# Patient Record
Sex: Male | Born: 1945 | Race: White | Hispanic: No | Marital: Married | Smoking: Current every day smoker
Health system: Southern US, Community
[De-identification: ages and names within clinical notes are randomized; demographics above are authoritative.]

## PROBLEM LIST (undated history)

## (undated) DIAGNOSIS — Z86010 Personal history of colonic polyps: Secondary | ICD-10-CM

## (undated) DIAGNOSIS — Z860101 Personal history of adenomatous and serrated colon polyps: Secondary | ICD-10-CM

## (undated) DIAGNOSIS — E785 Hyperlipidemia, unspecified: Secondary | ICD-10-CM

## (undated) DIAGNOSIS — J449 Chronic obstructive pulmonary disease, unspecified: Secondary | ICD-10-CM

## (undated) HISTORY — DX: Chronic obstructive pulmonary disease, unspecified: J44.9

## (undated) HISTORY — DX: Personal history of colonic polyps: Z86.010

## (undated) HISTORY — DX: Personal history of adenomatous and serrated colon polyps: Z86.0101

## (undated) HISTORY — DX: Hyperlipidemia, unspecified: E78.5

---

## 2001-11-06 ENCOUNTER — Ambulatory Visit (HOSPITAL_COMMUNITY): Admission: RE | Admit: 2001-11-06 | Discharge: 2001-11-06 | Payer: Self-pay | Admitting: General Surgery

## 2003-02-25 ENCOUNTER — Ambulatory Visit (HOSPITAL_COMMUNITY): Admission: RE | Admit: 2003-02-25 | Discharge: 2003-02-25 | Payer: Self-pay | Admitting: General Surgery

## 2004-11-18 ENCOUNTER — Ambulatory Visit (HOSPITAL_COMMUNITY): Admission: RE | Admit: 2004-11-18 | Discharge: 2004-11-18 | Payer: Self-pay | Admitting: Family Medicine

## 2004-12-27 ENCOUNTER — Ambulatory Visit (HOSPITAL_COMMUNITY): Admission: RE | Admit: 2004-12-27 | Discharge: 2004-12-27 | Payer: Self-pay | Admitting: Ophthalmology

## 2005-11-14 ENCOUNTER — Ambulatory Visit (HOSPITAL_COMMUNITY): Admission: RE | Admit: 2005-11-14 | Discharge: 2005-11-14 | Payer: Self-pay | Admitting: Ophthalmology

## 2007-09-04 ENCOUNTER — Ambulatory Visit (HOSPITAL_COMMUNITY): Admission: RE | Admit: 2007-09-04 | Discharge: 2007-09-04 | Payer: Self-pay | Admitting: Family Medicine

## 2007-09-10 ENCOUNTER — Ambulatory Visit (HOSPITAL_COMMUNITY): Admission: RE | Admit: 2007-09-10 | Discharge: 2007-09-10 | Payer: Self-pay | Admitting: Family Medicine

## 2008-08-30 IMAGING — CR DG CHEST SPECIAL VIEW
1 series · 1 of 1 positions shown · non-contrast
Comparison: none

HISTORY: Abnormal chest x-ray, question lung nodule versus nipple shadow

[view not recorded]
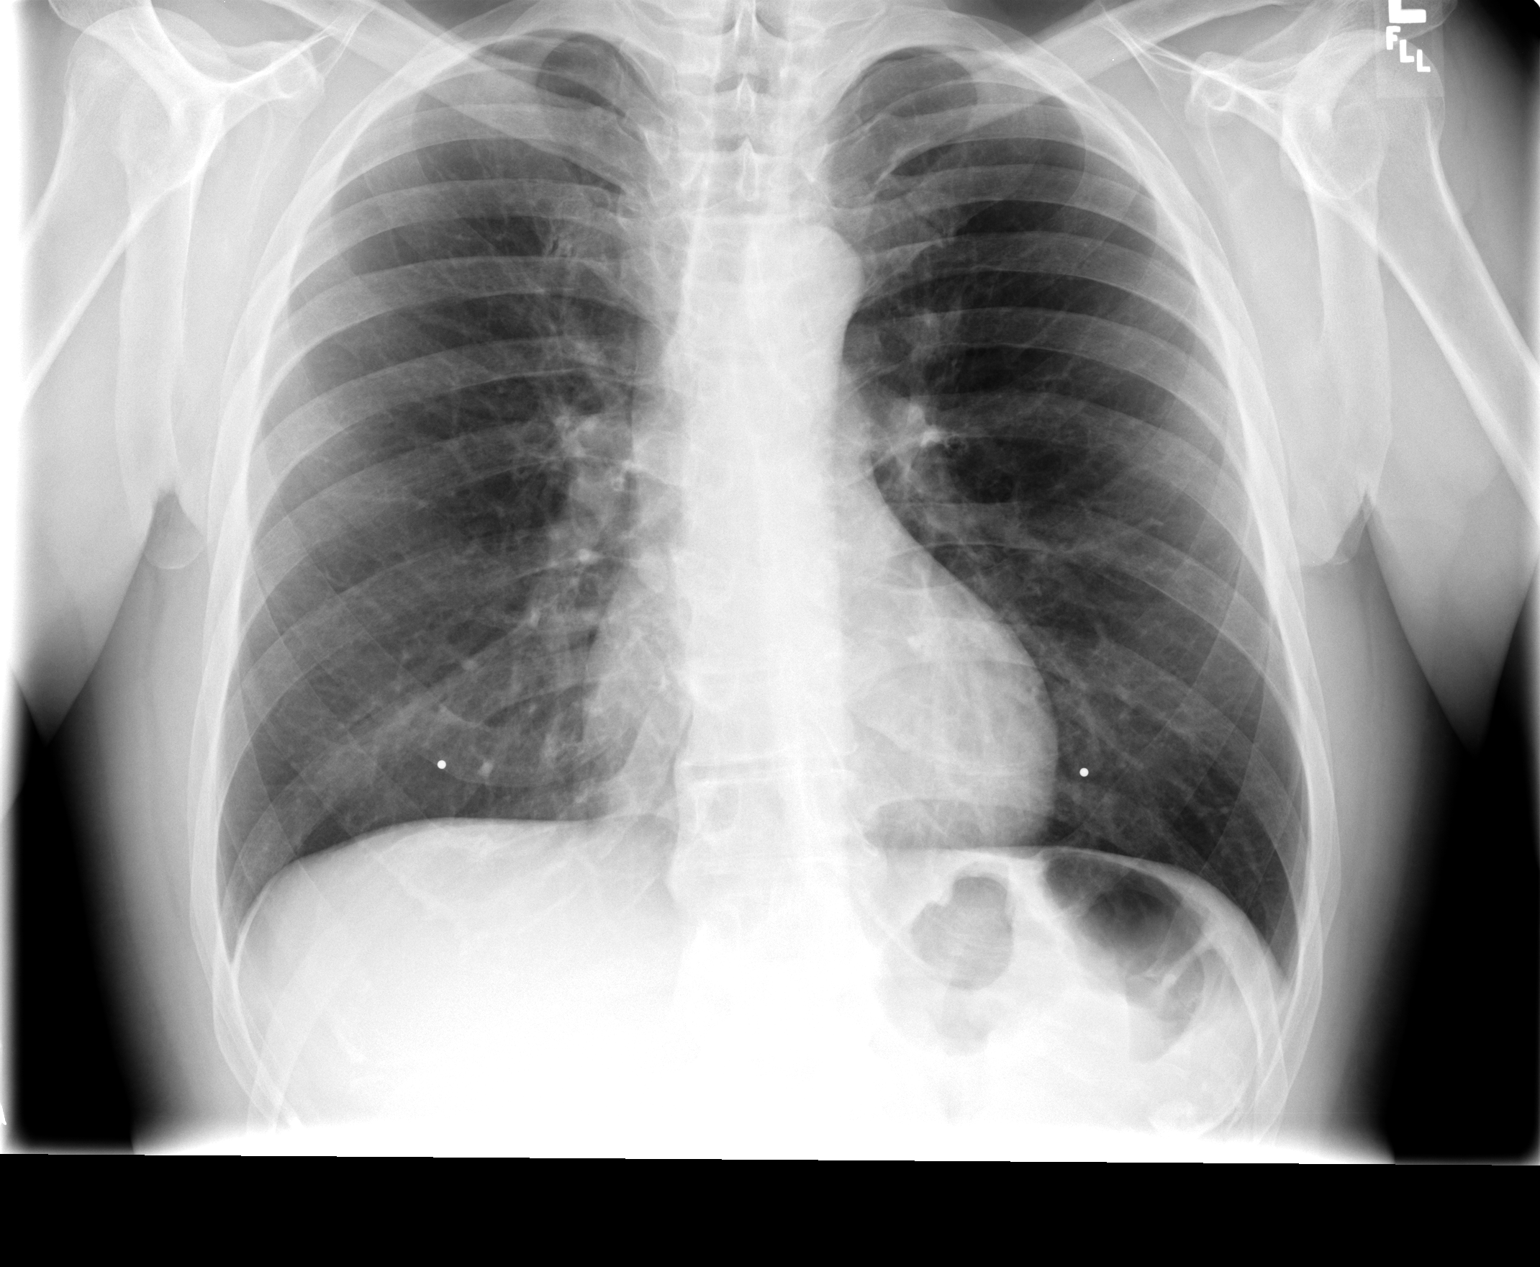

[1 of 1 positions shown; findings below may reference images not displayed]

CHEST ONE VIEW PA WITH NIPPLE MARKERS:

Comparison 09/04/2007

The right nipple marker corresponds in position with the nodular density seen on
previous exam.
Finding is compatible with nipple shadow.
No evidence of pulmonary nodule.
Heart size, mediastinal contours, pulmonary vascularity remain normal.
Persistent peribronchial thickening without infiltrate or effusion.
IMPRESSION: No evidence of pulmonary nodule.
Mild persistent bronchitic changes.

## 2008-09-03 ENCOUNTER — Ambulatory Visit (HOSPITAL_COMMUNITY): Admission: RE | Admit: 2008-09-03 | Discharge: 2008-09-03 | Payer: Self-pay | Admitting: Family Medicine

## 2008-09-22 ENCOUNTER — Ambulatory Visit: Payer: Self-pay | Admitting: Internal Medicine

## 2008-10-15 ENCOUNTER — Ambulatory Visit: Payer: Self-pay | Admitting: Internal Medicine

## 2008-10-15 ENCOUNTER — Encounter: Payer: Self-pay | Admitting: Internal Medicine

## 2008-10-15 ENCOUNTER — Ambulatory Visit (HOSPITAL_COMMUNITY): Admission: RE | Admit: 2008-10-15 | Discharge: 2008-10-15 | Payer: Self-pay | Admitting: Internal Medicine

## 2008-10-15 HISTORY — PX: COLONOSCOPY: SHX174

## 2009-09-03 ENCOUNTER — Ambulatory Visit (HOSPITAL_COMMUNITY): Admission: RE | Admit: 2009-09-03 | Discharge: 2009-09-03 | Payer: Self-pay | Admitting: Family Medicine

## 2010-08-30 ENCOUNTER — Ambulatory Visit (HOSPITAL_COMMUNITY): Admission: RE | Admit: 2010-08-30 | Discharge: 2010-08-30 | Payer: Self-pay | Admitting: Family Medicine

## 2011-04-18 NOTE — Op Note (Signed)
NAME:  Wesley Hodges, Wesley Hodges NO.:  [id number]   MEDICAL RECORD NO.:  [id number]          PATIENT TYPE:  AMB   LOCATION:  DAY                           FACILITY:  [facility]   PHYSICIAN:  R. Roetta Sessions, M.D. DATE OF BIRTH:  23-Apr-1946   DATE OF PROCEDURE:  10/15/2008  DATE OF DISCHARGE:                               OPERATIVE REPORT   PROCEDURE:  Colonoscopy with snare polypectomy and polyp ablation.   INDICATIONS FOR PROCEDURE:  A 65 year old gentleman with history of  colonic adenomas on multiple colonoscopies with Dr. Katrinka Blazing previously.  It has been a good 5 years since he last his colon imaged.  He has no  lower GI tract symptoms.  Colonoscopy is now being done.  Risks,  benefits, alternatives, and limitations have been reviewed.  Questions  answered.  Please see the documentation in the medical record.   PROCEDURE NOTE:  O2 saturation, blood pressure, pulse, and respirations  were monitored throughout the entire procedure.   CONSCIOUS SEDATION:  IV Versed and Demerol in incremental doses.   INSTRUMENTATION:  Pentax video chip system.   FINDINGS:  Digital rectal exam revealed no abnormalities.  Endoscopic  findings:  Prep was adequate.  Colon:  Colonic mucosa was surveyed from  the rectosigmoid junction through the left transverse, right colon, to  the appendiceal orifice, ileocecal valve, and cecum.  These structures  were well seen and photographed for the record.  From this level, the  scope was slowly withdrawn.  All previously mentioned mucosal surfaces  were again seen.  The patient was noted to have 3 sigmoid polyps 16 mm,  pedunculated, removed with hot snare cautery.  There were multiple  diminutive polyps in the distal sigmoid and rectosigmoid, which were  ablated with the tip of snare cautery unit.  The remainder of the  colonic mucosa appeared normal.  The scope was pulled down to the rectum  with thorough examination of the rectal mucosa including the  retroflexed  view of the anal verge that demonstrated some diminutive polyps just  inside the anal verge, which were ablated with the tip of snare cautery  unit.  Otherwise, rectal mucosa appeared normal.  The patient tolerated  the procedure well and was reactive to Endoscopy.   IMPRESSION:  1. Diminutive rectal polyps, ablated as described above.  2. Sigmoid and rectosigmoid polyps removed with snare cautery and/or      ablated with the tip of snare cautery unit.  The remainder of the      colonic mucosa appeared normal.   RECOMMENDATIONS:  1. Follow up on path.  2. Further recommendations to follow.      Jonathon Bellows, M.D.  Electronically Signed     RMR/MEDQ  D:  10/15/2008  T:  10/15/2008  Job:  [id number]   cc:   Donna Bernard, M.D.  Fax: [phone]

## 2011-04-18 NOTE — H&P (Signed)
NAME:  JAMARION, JUMONVILLE NO.:  [id number]   MEDICAL RECORD NO.:  [id number]          PATIENT TYPE:  AMB   LOCATION:  DAY                           FACILITY:  [facility]   PHYSICIAN:  R. Roetta Sessions, M.D. DATE OF BIRTH:  July 29, 1946   DATE OF ADMISSION:  DATE OF DISCHARGE:  LH                              HISTORY & PHYSICAL   REASON FOR VISIT:  1. Time for colonoscopy.  2. History of multiple colonic polyps.   HISTORY OF PRESENT ILLNESS:  The patient is a very pleasant 65 year old  gentleman who has a history of multiple colonic polyps in the past and  has had multiple colonoscopies with Dr. Elpidio Anis.  He states he is  due for his 5-year followup now.  He denies any problem with his bowel  movements.  No blood in the stool or melena.  No abdominal pain.  No  nausea or vomiting, heartburn, dysphagia, or odynophagia.  His weight  has been stable.  Family history is significant for colon cancer in the  grandfather.  The patient denies a history of prior adenomatous polyp as  well as hyperplastic polyps.   CURRENT MEDICATIONS:  1. Propecia daily.  2. Aspirin 81 mg daily.  3. Vitamin C 1000 mg daily.  4. Over-the-counter Sinus p.r.n.   ALLERGIES:  No known drug allergies.   PAST MEDICAL HISTORY:  Negative for chronic illnesses.   PAST SURGICAL HISTORY:  He has had multiple colonoscopies.  In 2002, he  had multiple small, non-bleeding, erythematous polyps in the descending  colon, sigmoid colon, and rectum.  Pathology revealed hyperplastic  polyps from the submitted tissue specimens.  His last colonoscopy was in  2004, he had a normal study at that time.  Back in 2001, he had multiple  polyps removed and did have microperforation, but did not require any  surgery at that time.  He has a history of positive H. pylori antibody,  but was not treated according to the medical records or the patient's  recall.   FAMILY HISTORY:  Positive for colon cancer in the  grandfather per  medical record.  Father died at age 68 due to an MI.   SOCIAL HISTORY:  He is married, no children.  He is a Medical illustrator.  He  smokes one-half pack of cigarettes daily.  Drinks 2-3 alcoholic  beverages daily.   REVIEW OF SYSTEMS:  GI:  See HPI.  CONSTITUTIONAL:  See HPI.  CARDIOPULMONARY:  No chest pain, shortness of breath, palpitations, or  cough.  GENITOURINARY:  No dysuria or hematuria.   PHYSICAL EXAMINATION:  VITAL SIGNS:  Weight 167.5, height 5 feet 10  inches, temperature 97.8, blood pressure 128/84, and pulse 76.  GENERAL:  Pleasant, well-nourished, well-developed, Caucasian gentleman  in no acute distress.  SKIN:  Warm and dry.  No jaundice.  HEENT:  Sclerae nonicteric.  Oropharyngeal mucosa moist and pink.  No  lesion, erythema, or exudate.  No lymphadenopathy or thyromegaly.  CHEST:  Lungs are clear to auscultation.  CARDIAC:  Regular rate and rhythm.  Normal S1 and S2.  No murmurs,  rubs,  or gallops.  ABDOMEN:  Positive bowel sound.  Abdomen is soft, nontender, and  nondistended.  No organomegaly or masses.  No rebound or guarding.  No  abdominal bruits or hernias.  LOWER EXTREMITIES:  No edema.   IMPRESSION:  Mr. Fertig is a 65 year old gentleman with history of  multiple chronic polyps, and according to Dr. Michaelle Copas records, he had  adenomatous polyps in the past as well.  He is due for his 5-year  surveillance colonoscopy.  He also has a history of positive  Helicobacter pylori antibody, but no clear records that he has it  treated.   PLAN:  1. Colonoscopy with Dr. Jena Gauss in the near future.  2. I have discussed the risks, alternatives, and benefits with regards      to, but not limited to the risk of reaction to medication,      bleeding, infection, and perforation.  He is agreeable to proceed.  3. We will hold aspirin 4 days prior to procedure.  4. Helicobacter pylori stool antigen testing.      Tana Coast, P.AJonathon Bellows,  M.D.  Electronically Signed    LL/MEDQ  D:  09/22/2008  T:  09/23/2008  Job:  [id number]

## 2011-04-21 NOTE — H&P (Signed)
   NAME:  Wesley Hodges, Wesley Hodges NO.:  [id number]   MEDICAL RECORD NO.:  [id number]                   PATIENT TYPE:   LOCATION:                                       FACILITY:   PHYSICIAN:  Dirk Dress. Katrinka Blazing, M.D.                DATE OF BIRTH:   DATE OF ADMISSION:  DATE OF DISCHARGE:                                HISTORY & PHYSICAL   HISTORY OF PRESENT ILLNESS:  The patient is a 65 year old male with history  of multiple polyps. He is status post colonoscopy with polypectomy on three  separate occasions. Grandfather had colon cancer and was status post colon  resection. Last colonoscopy was in 2002. At that time, he had multiple  polyps which were biopsied and consistent with adenomatous polyps and  hyperplastic polyps.   PAST MEDICAL HISTORY:  He has no other medical illnesses.   MEDICATIONS:  None. He does take an aspirin per day.   PAST SURGICAL HISTORY:  None.   PHYSICAL EXAMINATION:  GENERAL: A pleasant male who looks stated age. He is  in no acute distress.  VITAL SIGNS: Blood pressure 142/88, pulse 80, respiratory rate 18, weight  177 pounds.  HEENT: Unremarkable.  NECK: Supple without jugular venous distention. No bruit.  CHEST: Clear to auscultation.  HEART: Regular rate and rhythm. Without murmur, rub, or gallop.  ABDOMEN: Soft and nontender. No masses.  EXTREMITIES: No clubbing, cyanosis, or edema.  NEURO: No focal, motor, sensory or cerebellar deficits.   IMPRESSION:  Recurrent colonic polyposis.   PLAN:  Colonoscopy.                                               Dirk Dress. Katrinka Blazing, M.D.    LCS/MEDQ  D:  02/24/2003  T:  02/24/2003  Job:  [id number]

## 2011-09-06 ENCOUNTER — Other Ambulatory Visit: Payer: Self-pay | Admitting: Family Medicine

## 2011-09-06 ENCOUNTER — Ambulatory Visit (HOSPITAL_COMMUNITY)
Admission: RE | Admit: 2011-09-06 | Discharge: 2011-09-06 | Disposition: A | Discharge status: Home / Self Care | Payer: BC Managed Care – PPO | Source: Ambulatory Visit | Attending: Family Medicine | Admitting: Family Medicine

## 2011-09-06 DIAGNOSIS — Z87891 Personal history of nicotine dependence: Secondary | ICD-10-CM | POA: Insufficient documentation

## 2011-09-06 DIAGNOSIS — F17200 Nicotine dependence, unspecified, uncomplicated: Secondary | ICD-10-CM

## 2011-09-06 DIAGNOSIS — J449 Chronic obstructive pulmonary disease, unspecified: Secondary | ICD-10-CM

## 2011-09-06 DIAGNOSIS — J4489 Other specified chronic obstructive pulmonary disease: Secondary | ICD-10-CM | POA: Insufficient documentation

## 2012-09-04 ENCOUNTER — Other Ambulatory Visit: Payer: Self-pay | Admitting: Family Medicine

## 2012-09-04 ENCOUNTER — Ambulatory Visit (HOSPITAL_COMMUNITY)
Admission: RE | Admit: 2012-09-04 | Discharge: 2012-09-04 | Disposition: A | Discharge status: Home / Self Care | Payer: BC Managed Care – PPO | Source: Ambulatory Visit | Attending: Family Medicine | Admitting: Family Medicine

## 2012-09-04 DIAGNOSIS — R059 Cough, unspecified: Secondary | ICD-10-CM | POA: Insufficient documentation

## 2012-09-04 DIAGNOSIS — R05 Cough: Secondary | ICD-10-CM | POA: Insufficient documentation

## 2013-08-25 ENCOUNTER — Telehealth: Payer: Self-pay | Admitting: Family Medicine

## 2013-08-25 IMAGING — CR DG CHEST 2V
2 series · 2 of 2 positions shown · non-contrast
Comparison: 09/06/2011

CLINICAL DATA: Cough

CHEST - 2 VIEW

[view not recorded (1 of 2)]
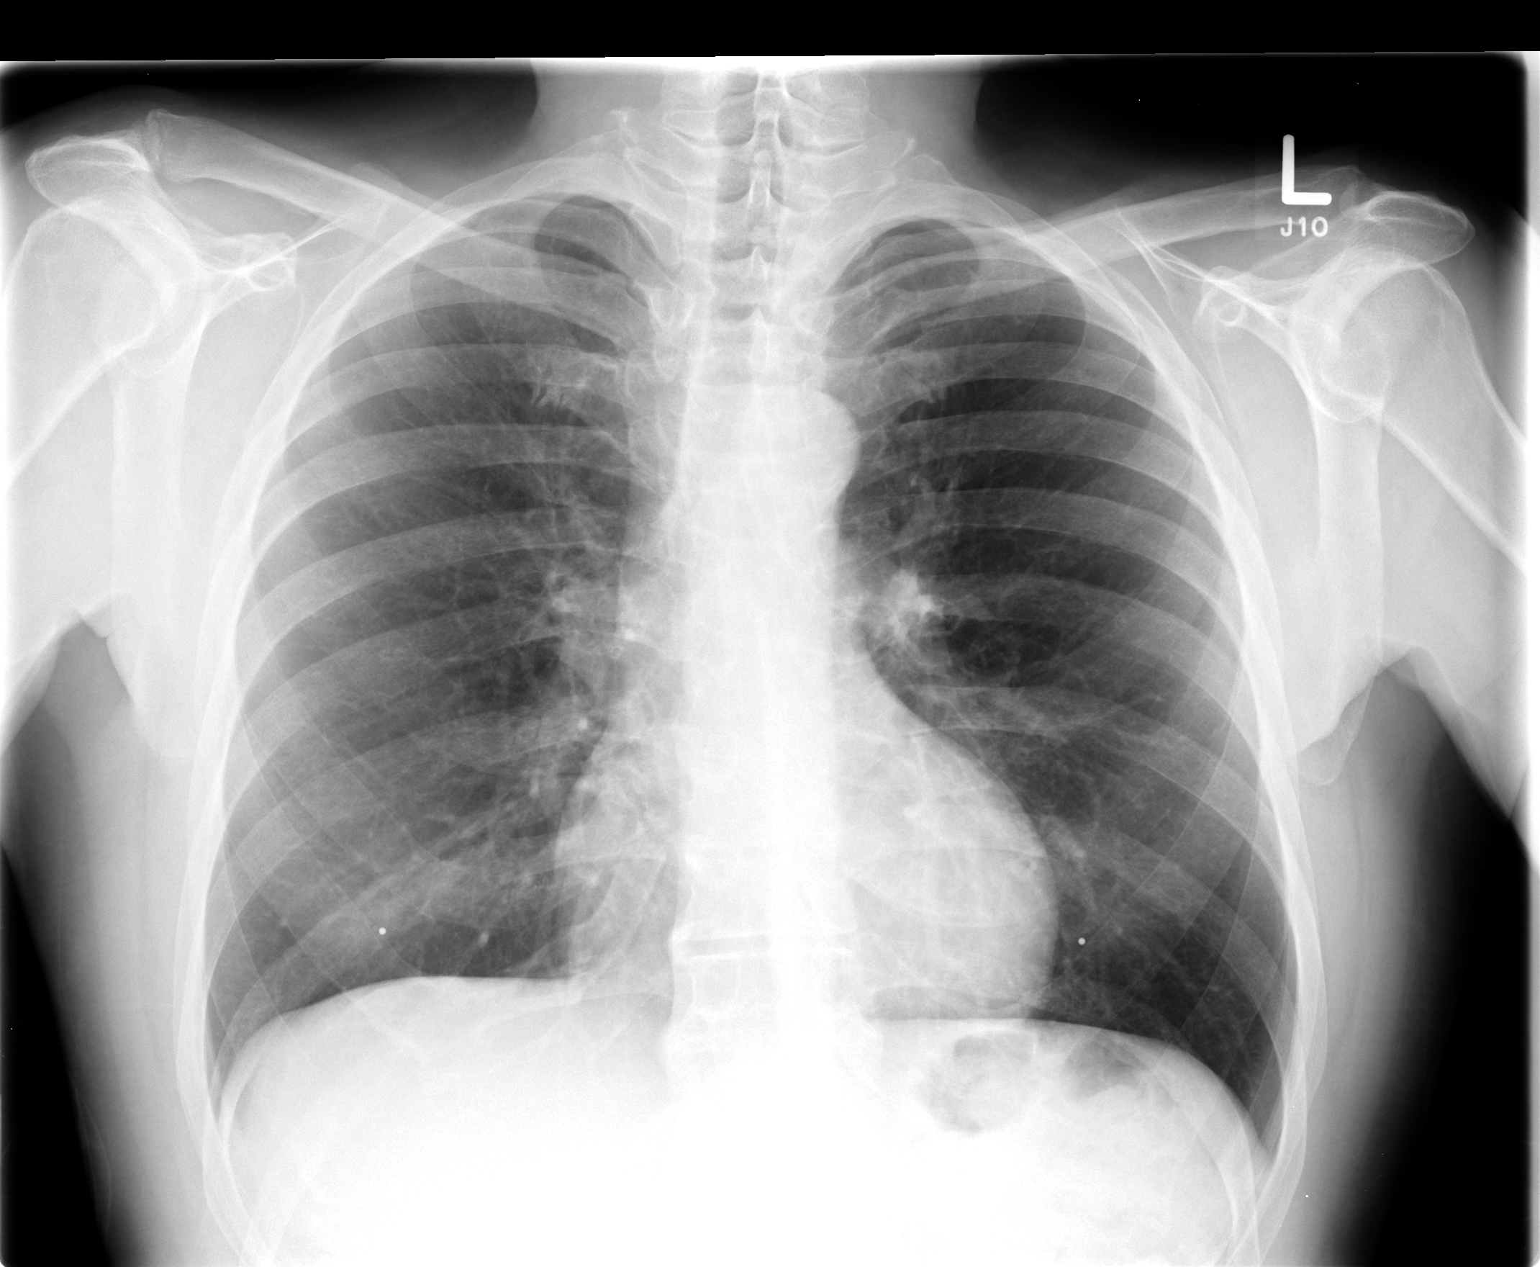

[view not recorded (2 of 2)]
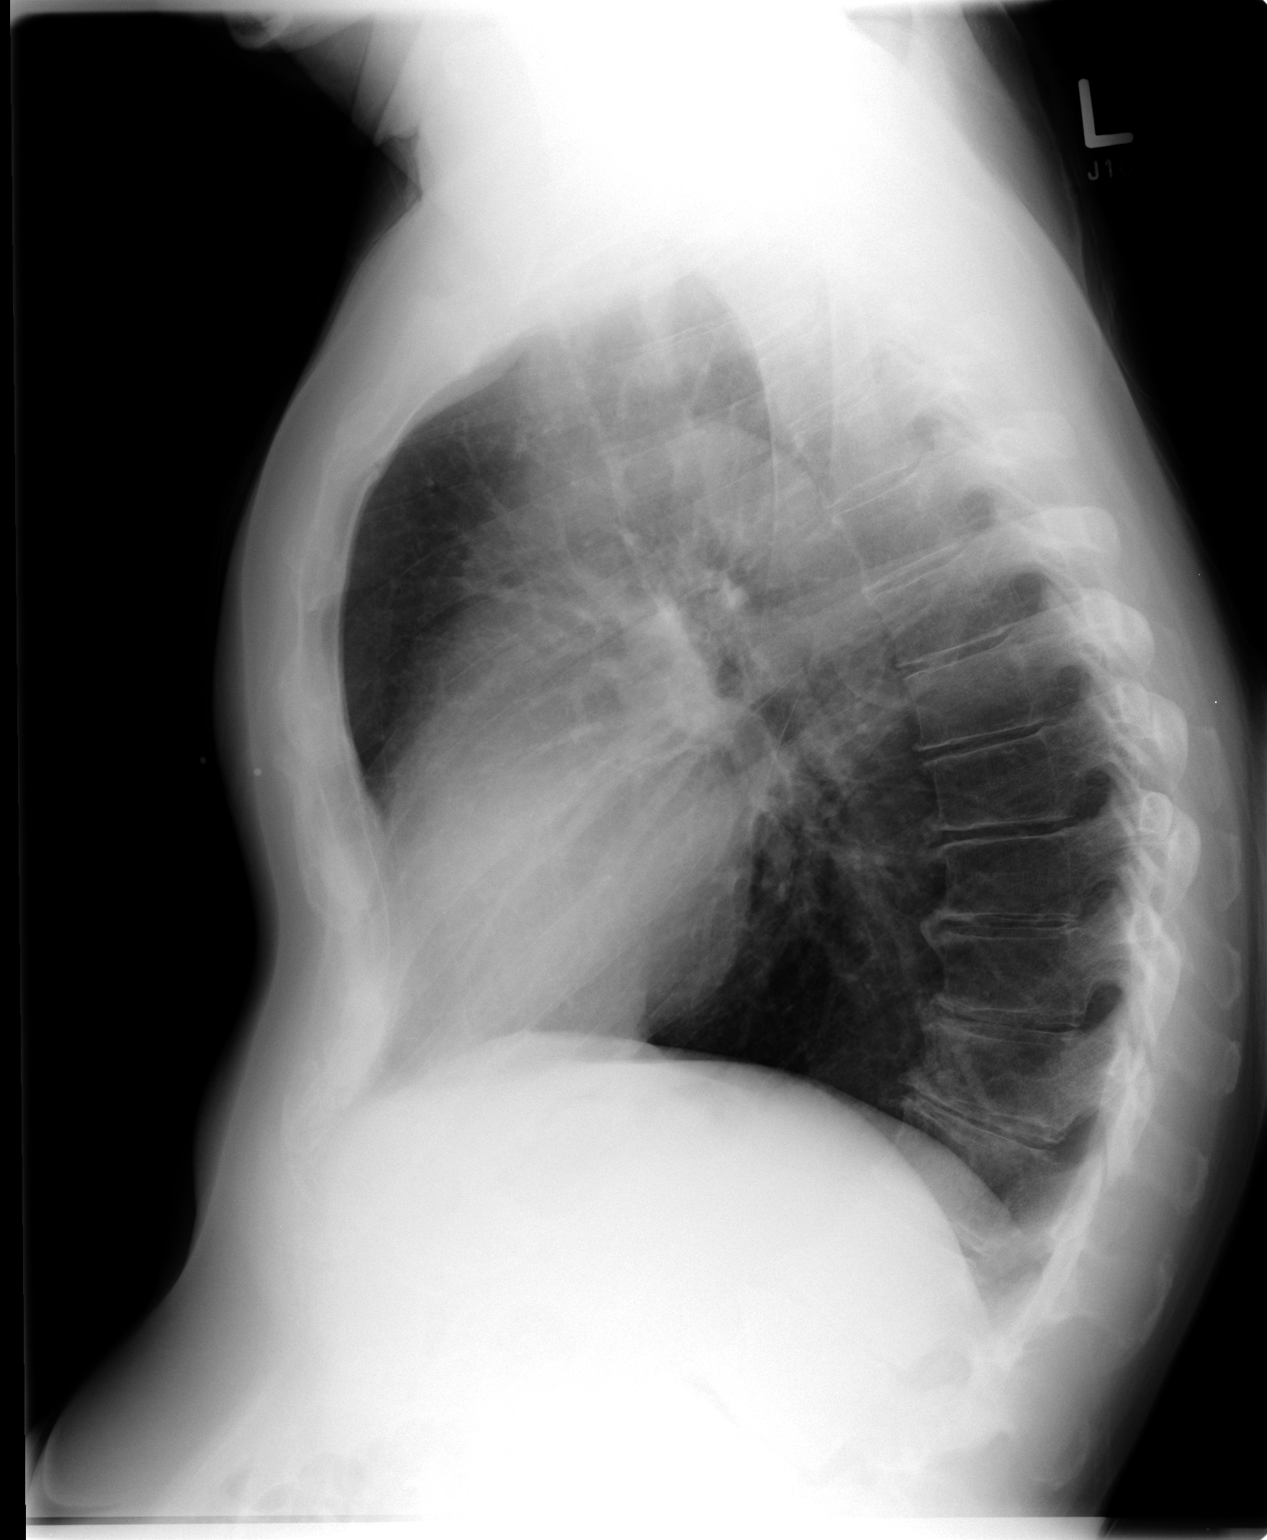

[2 of 2 positions shown; findings below may reference images not displayed]

FINDINGS: The lungs appears hyperexpanded but are clear.  No
effusions or pneumothoraces.   The heart, mediastinal structures
and hila appear normal.  The bony thorax is intact.
IMPRESSION: No active disease

## 2013-08-25 NOTE — Telephone Encounter (Signed)
Patient would like order for chest xray(please specify nipple markers) and BW paperwork

## 2013-08-25 NOTE — Telephone Encounter (Signed)
Lip liv met7 psa chxray with nipple markers

## 2013-08-26 ENCOUNTER — Other Ambulatory Visit: Payer: Self-pay

## 2013-08-26 DIAGNOSIS — E785 Hyperlipidemia, unspecified: Secondary | ICD-10-CM

## 2013-08-26 DIAGNOSIS — Z79899 Other long term (current) drug therapy: Secondary | ICD-10-CM

## 2013-08-26 DIAGNOSIS — J449 Chronic obstructive pulmonary disease, unspecified: Secondary | ICD-10-CM

## 2013-08-26 DIAGNOSIS — Z125 Encounter for screening for malignant neoplasm of prostate: Secondary | ICD-10-CM

## 2013-08-26 NOTE — Telephone Encounter (Signed)
Blood work and chest xray ordered. Patient was notified.

## 2013-09-02 ENCOUNTER — Ambulatory Visit (HOSPITAL_COMMUNITY)
Admission: RE | Admit: 2013-09-02 | Discharge: 2013-09-02 | Disposition: A | Discharge status: Home / Self Care | Payer: BC Managed Care – PPO | Source: Ambulatory Visit | Attending: Family Medicine | Admitting: Family Medicine

## 2013-09-02 DIAGNOSIS — F17200 Nicotine dependence, unspecified, uncomplicated: Secondary | ICD-10-CM | POA: Insufficient documentation

## 2013-09-02 DIAGNOSIS — J449 Chronic obstructive pulmonary disease, unspecified: Secondary | ICD-10-CM | POA: Insufficient documentation

## 2013-09-02 DIAGNOSIS — J4489 Other specified chronic obstructive pulmonary disease: Secondary | ICD-10-CM | POA: Insufficient documentation

## 2013-09-02 DIAGNOSIS — Q676 Pectus excavatum: Secondary | ICD-10-CM | POA: Insufficient documentation

## 2013-09-02 LAB — LIPID PANEL
Cholesterol: 217 mg/dL — ABNORMAL HIGH (ref 0–200)
HDL: 70 mg/dL (ref >39)
LDL Cholesterol: 134 mg/dL — ABNORMAL HIGH (ref 0–99)
Total CHOL/HDL Ratio: 3.1 Ratio
Triglycerides: 63 mg/dL (ref <150)
VLDL: 13 mg/dL (ref 0–40)

## 2013-09-02 LAB — BASIC METABOLIC PANEL
BUN: 13 mg/dL (ref 6–23)
CO2: 30 mEq/L (ref 19–32)
Calcium: 9.5 mg/dL (ref 8.4–10.5)
Chloride: 103 mEq/L (ref 96–112)
Creat: 0.79 mg/dL (ref 0.50–1.35)
Glucose, Bld: 98 mg/dL (ref 70–99)
Potassium: 4.9 mEq/L (ref 3.5–5.3)
Sodium: 137 mEq/L (ref 135–145)

## 2013-09-02 LAB — HEPATIC FUNCTION PANEL
ALT: 14 U/L (ref 0–53)
AST: 17 U/L (ref 0–37)
Albumin: 3.9 g/dL (ref 3.5–5.2)
Alkaline Phosphatase: 42 U/L (ref 39–117)
Bilirubin, Direct: 0.1 mg/dL (ref 0.0–0.3)
Indirect Bilirubin: 0.3 mg/dL (ref 0.0–0.9)
Total Bilirubin: 0.4 mg/dL (ref 0.3–1.2)
Total Protein: 6.2 g/dL (ref 6.0–8.3)

## 2013-09-02 LAB — PSA: PSA: 0.99 ng/mL (ref <=4.00)

## 2013-09-19 ENCOUNTER — Encounter: Payer: Self-pay | Admitting: Internal Medicine

## 2013-09-23 ENCOUNTER — Ambulatory Visit (INDEPENDENT_AMBULATORY_CARE_PROVIDER_SITE_OTHER): Payer: BC Managed Care – PPO | Admitting: Family Medicine

## 2013-09-23 ENCOUNTER — Encounter: Payer: Self-pay | Admitting: Family Medicine

## 2013-09-23 VITALS — BP 126/72 | Ht 66.5 in | Wt 167.0 lb

## 2013-09-23 DIAGNOSIS — Z0000 Encounter for general adult medical examination without abnormal findings: Secondary | ICD-10-CM

## 2013-09-23 DIAGNOSIS — J449 Chronic obstructive pulmonary disease, unspecified: Secondary | ICD-10-CM

## 2013-09-23 DIAGNOSIS — Z23 Encounter for immunization: Secondary | ICD-10-CM

## 2013-09-23 DIAGNOSIS — Z86010 Personal history of colonic polyps: Secondary | ICD-10-CM

## 2013-09-23 DIAGNOSIS — E785 Hyperlipidemia, unspecified: Secondary | ICD-10-CM

## 2013-09-23 NOTE — Progress Notes (Signed)
Subjective:    Patient ID: Wesley Hodges, male    DOB: 1946/04/30, 67 y.o.   MRN: [id number]  HPI  Patient arrives for his annual physical and to discuss recent lab work.   Pipe smoking four per day  Walks regularly five d per wk  Not on weekends  Good diet  Dr Kendell Bane did last five yrs ago,   Results for orders placed in visit on 08/26/13  LIPID PANEL      Result Value Range   Cholesterol 217 (*) 0 - 200 mg/dL   Triglycerides 63  <409 mg/dL   HDL 70  >81 mg/dL   Total CHOL/HDL Ratio 3.1     VLDL 13  0 - 40 mg/dL   LDL Cholesterol 191 (*) 0 - 99 mg/dL  HEPATIC FUNCTION PANEL      Result Value Range   Total Bilirubin 0.4  0.3 - 1.2 mg/dL   Bilirubin, Direct 0.1  0.0 - 0.3 mg/dL   Indirect Bilirubin 0.3  0.0 - 0.9 mg/dL   Alkaline Phosphatase 42  39 - 117 U/L   AST 17  0 - 37 U/L   ALT 14  0 - 53 U/L   Total Protein 6.2  6.0 - 8.3 g/dL   Albumin 3.9  3.5 - 5.2 g/dL  BASIC METABOLIC PANEL      Result Value Range   Sodium 137  135 - 145 mEq/L   Potassium 4.9  3.5 - 5.3 mEq/L   Chloride 103  96 - 112 mEq/L   CO2 30  19 - 32 mEq/L   Glucose, Bld 98  70 - 99 mg/dL   BUN 13  6 - 23 mg/dL   Creat 4.78  2.95 - 6.21 mg/dL   Calcium 9.5  8.4 - 30.8 mg/dL  PSA      Result Value Range   PSA 0.99  <=4.00 ng/mL     No problems or concerns.bms good breating good  Review of Systems  Constitutional: Negative for fever, activity change and appetite change.  HENT: Negative for congestion and rhinorrhea.   Eyes: Negative for discharge.  Respiratory: Negative for cough and wheezing.   Cardiovascular: Negative for chest pain.  Gastrointestinal: Negative for vomiting, abdominal pain and blood in stool.  Genitourinary: Negative for frequency and difficulty urinating.  Musculoskeletal: Negative for neck pain.  Skin: Negative for rash.  Allergic/Immunologic: Negative for environmental allergies and food allergies.  Neurological: Negative for weakness and headaches.   Psychiatric/Behavioral: Negative for agitation.       Objective:   Physical Exam  Constitutional: He appears well-developed and well-nourished.  HENT:  Head: Normocephalic and atraumatic.  Right Ear: External ear normal.  Left Ear: External ear normal.  Nose: Nose normal.  Mouth/Throat: Oropharynx is clear and moist.  Eyes: EOM are normal. Pupils are equal, round, and reactive to light.  Neck: Normal range of motion. Neck supple. No thyromegaly present.  Cardiovascular: Normal rate, regular rhythm and normal heart sounds.   No murmur heard. Pulmonary/Chest: Effort normal and breath sounds normal. No respiratory distress. He has no wheezes.  Abdominal: Soft. Bowel sounds are normal. He exhibits no distension and no mass. There is no tenderness.  Genitourinary: Penis normal.  Musculoskeletal: Normal range of motion. He exhibits no edema.  Lymphadenopathy:    He has no cervical adenopathy.  Neurological: He is alert. He exhibits normal muscle tone.  Skin: Skin is warm and dry. No erythema.  Psychiatric: He has a normal mood  and affect. His behavior is normal. Judgment normal.   Prostate within normal limits       Assessment & Plan:  Impression wellness exam #2 hyperlipidemia and discuss. #3 chronic smoking #4 COPD discussed plan flu shot pneumonia shot check yearly. WSL

## 2013-09-28 DIAGNOSIS — Z86010 Personal history of colonic polyps: Secondary | ICD-10-CM | POA: Insufficient documentation

## 2013-09-28 DIAGNOSIS — Z860101 Personal history of adenomatous and serrated colon polyps: Secondary | ICD-10-CM | POA: Insufficient documentation

## 2013-09-28 DIAGNOSIS — J449 Chronic obstructive pulmonary disease, unspecified: Secondary | ICD-10-CM | POA: Insufficient documentation

## 2013-09-28 DIAGNOSIS — E785 Hyperlipidemia, unspecified: Secondary | ICD-10-CM | POA: Insufficient documentation

## 2013-09-28 LAB — HM COLONOSCOPY

## 2013-10-08 ENCOUNTER — Other Ambulatory Visit: Payer: Self-pay | Admitting: Internal Medicine

## 2013-10-08 ENCOUNTER — Ambulatory Visit (INDEPENDENT_AMBULATORY_CARE_PROVIDER_SITE_OTHER): Payer: BC Managed Care – PPO | Admitting: Gastroenterology

## 2013-10-08 ENCOUNTER — Encounter: Payer: Self-pay | Admitting: Gastroenterology

## 2013-10-08 VITALS — BP 104/60 | HR 66 | Temp 98.2°F | Wt 166.2 lb

## 2013-10-08 DIAGNOSIS — Z860100 Personal history of colon polyps, unspecified: Secondary | ICD-10-CM

## 2013-10-08 DIAGNOSIS — Z860101 Personal history of adenomatous and serrated colon polyps: Secondary | ICD-10-CM

## 2013-10-08 MED ORDER — PEG-KCL-NACL-NASULF-NA ASC-C 100 G PO SOLR: 1.0000 | ORAL | Status: DC

## 2013-10-08 NOTE — Patient Instructions (Signed)
1. Colonoscopy with Dr. Rourk as scheduled. Please see separate instructions.  

## 2013-10-08 NOTE — Progress Notes (Signed)
Primary Care Physician:  LUKING,W S, MD  Primary Gastroenterologist:  Michael Rourk, MD   Chief Complaint  Patient presents with  . Colonoscopy    HPI:  Wesley Hodges is a 67 y.o. male here to schedule five-year surveillance colonoscopy. He has a history of numerous colonic adenomatous on prior colonoscopies but Dr. Leroy Smith. His last colonoscopy was in November 2009 by Dr. Rourk. He had multiple hyperplastic polyps removed. Patient also has a history of prior positive H. pylori serologies and was not treated. 2009 we did check an H. pylori stool antigen which was negative. He denies any heartburn, dysphagia, weight loss, anorexia, abdominal pain, melena, rectal bleeding, constipation, diarrhea.  Current Outpatient Prescriptions  Medication Sig Dispense Refill  . Ascorbic Acid (VITAMIN C) 1000 MG tablet Take 1,000 mg by mouth daily.      . aspirin 81 MG tablet Take 81 mg by mouth daily.      . cholecalciferol (VITAMIN D) 1000 UNITS tablet Take 1,000 Units by mouth daily.      . finasteride (PROPECIA) 1 MG tablet Take 1 mg by mouth daily.       No current facility-administered medications for this visit.    Allergies as of 10/08/2013  . (No Known Allergies)    Past Medical History  Diagnosis Date  . Hx of adenomatous colonic polyps     On multiple colonoscopies by Dr. Leroy Smith.  . COPD (chronic obstructive pulmonary disease)   . Hyperlipidemia     Past Surgical History  Procedure Laterality Date  . Colonoscopy  10/15/2008    RMR:Diminutive rectal polyps, ablated as described above/. Sigmoid and rectosigmoid polyps removed with snare cautery and/or ablated with the tip of snare cautery unit.  The remainder of the colonic mucosa appeared normal    Family History  Problem Relation Age of Onset  . Heart attack Father     Age 55  . Colon cancer Other     maternal grandfather, deceased age 90    History   Social History  . Marital Status: Married    Spouse Name:  N/A    Number of Children: 0  . Years of Education: N/A   Occupational History  . Salesman    Social History Main Topics  . Smoking status: Current Every Day Smoker    Types: Pipe  . Smokeless tobacco: Not on file  . Alcohol Use: Yes     Comment: 3 beers daily  . Drug Use: No  . Sexual Activity: Not on file   Other Topics Concern  . Not on file   Social History Narrative  . No narrative on file      ROS:  General: Negative for anorexia, weight loss, fever, chills, fatigue, weakness. Eyes: Negative for vision changes.  ENT: Negative for hoarseness, difficulty swallowing , nasal congestion. CV: Negative for chest pain, angina, palpitations, dyspnea on exertion, peripheral edema.  Respiratory: Negative for dyspnea at rest, dyspnea on exertion, cough, sputum, wheezing.  GI: See history of present illness. GU:  Negative for dysuria, hematuria, urinary incontinence, urinary frequency, nocturnal urination.  MS: Negative for joint pain, low back pain.  Derm: Negative for rash or itching.  Neuro: Negative for weakness, abnormal sensation, seizure, frequent headaches, memory loss, confusion.  Psych: Negative for anxiety, depression, suicidal ideation, hallucinations.  Endo: Negative for unusual weight change.  Heme: Negative for bruising or bleeding. Allergy: Negative for rash or hives.    Physical Examination:  BP 104/60  Pulse   66  Temp(Src) 98.2 F (36.8 C) (Oral)  Wt 166 lb 3.2 oz (75.388 kg)   General: Well-nourished, well-developed in no acute distress.  Head: Normocephalic, atraumatic.   Eyes: Conjunctiva pink, no icterus. Mouth: Oropharyngeal mucosa moist and pink , no lesions erythema or exudate. Neck: Supple without thyromegaly, masses, or lymphadenopathy.  Lungs: Clear to auscultation bilaterally.  Heart: Regular rate and rhythm, no murmurs rubs or gallops.  Abdomen: Bowel sounds are normal, nontender, nondistended, no hepatosplenomegaly or masses, no  abdominal bruits or    hernia , no rebound or guarding.   Rectal: deferred Extremities: No lower extremity edema. No clubbing or deformities.  Neuro: Alert and oriented x 4 , grossly normal neurologically.  Skin: Warm and dry, no rash or jaundice.   Psych: Alert and cooperative, normal mood and affect.  Labs: No results found for this basename: WBC,  HGB,  HCT,  MCV,  PLT   Lab Results  Component Value Date   CREATININE 0.79 09/02/2013   BUN 13 09/02/2013   NA 137 09/02/2013   K 4.9 09/02/2013   CL 103 09/02/2013   CO2 30 09/02/2013   Lab Results  Component Value Date   ALT 14 09/02/2013   AST 17 09/02/2013   ALKPHOS 42 09/02/2013   BILITOT 0.4 09/02/2013     Imaging Studies: No results found.    

## 2013-10-08 NOTE — Assessment & Plan Note (Signed)
Due for five-year surveillance colonoscopy at this time.  I have discussed the risks, alternatives, benefits with regards to but not limited to the risk of reaction to medication, bleeding, infection, perforation and the patient is agreeable to proceed. Written consent to be obtained.

## 2013-10-13 NOTE — Progress Notes (Signed)
cc'd to pcp 

## 2013-10-21 ENCOUNTER — Encounter (HOSPITAL_COMMUNITY): Payer: Self-pay

## 2013-11-03 ENCOUNTER — Encounter (HOSPITAL_COMMUNITY): Payer: Self-pay | Admitting: *Deleted

## 2013-11-03 ENCOUNTER — Ambulatory Visit (HOSPITAL_COMMUNITY)
Admission: RE | Admit: 2013-11-03 | Discharge: 2013-11-03 | Disposition: A | Discharge status: Home / Self Care | Payer: BC Managed Care – PPO | Source: Ambulatory Visit | Attending: Internal Medicine | Admitting: Internal Medicine

## 2013-11-03 ENCOUNTER — Encounter (HOSPITAL_COMMUNITY)
Admission: RE | Disposition: A | Discharge status: Home / Self Care | Payer: Self-pay | Source: Ambulatory Visit | Attending: Internal Medicine

## 2013-11-03 DIAGNOSIS — Z860100 Personal history of colon polyps, unspecified: Secondary | ICD-10-CM | POA: Insufficient documentation

## 2013-11-03 DIAGNOSIS — K621 Rectal polyp: Secondary | ICD-10-CM

## 2013-11-03 DIAGNOSIS — D128 Benign neoplasm of rectum: Secondary | ICD-10-CM | POA: Insufficient documentation

## 2013-11-03 DIAGNOSIS — E785 Hyperlipidemia, unspecified: Secondary | ICD-10-CM | POA: Insufficient documentation

## 2013-11-03 DIAGNOSIS — J449 Chronic obstructive pulmonary disease, unspecified: Secondary | ICD-10-CM | POA: Insufficient documentation

## 2013-11-03 DIAGNOSIS — D126 Benign neoplasm of colon, unspecified: Secondary | ICD-10-CM | POA: Insufficient documentation

## 2013-11-03 DIAGNOSIS — K620 Anal polyp: Secondary | ICD-10-CM

## 2013-11-03 DIAGNOSIS — K5730 Diverticulosis of large intestine without perforation or abscess without bleeding: Secondary | ICD-10-CM | POA: Insufficient documentation

## 2013-11-03 DIAGNOSIS — Z1211 Encounter for screening for malignant neoplasm of colon: Secondary | ICD-10-CM

## 2013-11-03 DIAGNOSIS — J4489 Other specified chronic obstructive pulmonary disease: Secondary | ICD-10-CM | POA: Insufficient documentation

## 2013-11-03 HISTORY — PX: COLONOSCOPY: SHX5424

## 2013-11-03 SURGERY — COLONOSCOPY
Anesthesia: Moderate Sedation

## 2013-11-03 MED ORDER — MEPERIDINE HCL 100 MG/ML IJ SOLN
INTRAMUSCULAR | Status: AC
  Filled 2013-11-03: qty 2

## 2013-11-03 MED ORDER — SODIUM CHLORIDE 0.9 % IV SOLN
INTRAVENOUS | Status: DC
  Administered 2013-11-03: 1000 mL via INTRAVENOUS

## 2013-11-03 MED ORDER — MEPERIDINE HCL 100 MG/ML IJ SOLN
INTRAMUSCULAR | Status: DC | PRN
  Administered 2013-11-03: 25 mg via INTRAVENOUS
  Administered 2013-11-03: 50 mg via INTRAVENOUS
  Administered 2013-11-03: 25 mg via INTRAVENOUS

## 2013-11-03 MED ORDER — MIDAZOLAM HCL 5 MG/5ML IJ SOLN
INTRAMUSCULAR | Status: DC | PRN
  Administered 2013-11-03: 2 mg via INTRAVENOUS
  Administered 2013-11-03: 1 mg via INTRAVENOUS
  Administered 2013-11-03: 2 mg via INTRAVENOUS

## 2013-11-03 MED ORDER — MIDAZOLAM HCL 5 MG/5ML IJ SOLN
INTRAMUSCULAR | Status: AC
  Filled 2013-11-03: qty 10

## 2013-11-03 MED ORDER — ONDANSETRON HCL 4 MG/2ML IJ SOLN
INTRAMUSCULAR | Status: AC
  Filled 2013-11-03: qty 2

## 2013-11-03 MED ORDER — ONDANSETRON HCL 4 MG/2ML IJ SOLN
INTRAMUSCULAR | Status: DC | PRN
  Administered 2013-11-03: 4 mg via INTRAVENOUS

## 2013-11-03 MED ORDER — STERILE WATER FOR IRRIGATION IR SOLN
Status: DC | PRN
  Administered 2013-11-03: 08:00:00

## 2013-11-03 NOTE — Interval H&P Note (Signed)
History and Physical Interval Note:  11/03/2013 7:33 AM  Wesley Hodges  has presented today for surgery, with the diagnosis of HISTORY OF COLON POLYPS  The various methods of treatment have been discussed with the patient and family. After consideration of risks, benefits and other options for treatment, the patient has consented to  Procedure(s) with comments: COLONOSCOPY (N/A) - 7:30 as a surgical intervention .  The patient's history has been reviewed, patient examined, no change in status, stable for surgery.  I have reviewed the patient's chart and labs.  Questions were answered to the patient's satisfaction.     No change. Colonoscopy per plan.The risks, benefits, limitations, alternatives and imponderables have been reviewed with the patient. Questions have been answered. All parties are agreeable.   Eula Listen

## 2013-11-03 NOTE — Op Note (Signed)
[facility] [address] [place], [address]   COLONOSCOPY PROCEDURE REPORT  PATIENT: Wesley Hodges, Wesley Hodges  MR#:         [id number] BIRTHDATE: 06/13/1946 , 67  yrs. old GENDER: Male ENDOSCOPIST: R.  Roetta Sessions, MD FACP FACG REFERRED BY:  Simone Curia, M.D. PROCEDURE DATE:  11/03/2013 PROCEDURE:     Colonoscopy with biopsy, snare polypectomy and polyp ablation  INDICATIONS: history of colonic adenoma  INFORMED CONSENT:  The risks, benefits, alternatives and imponderables including but not limited to bleeding, perforation as well as the possibility of a missed lesion have been reviewed.  The potential for biopsy, lesion removal, etc. have also been discussed.  Questions have been answered.  All parties agreeable. Please see the history and physical in the medical record for more information.  MEDICATIONS: Versed 5 mg IV and Demerol 100 mg IV in divided doses. Zofran 4 mg IV  DESCRIPTION OF PROCEDURE:  After a digital rectal exam was performed, the EC-[id number] ([id number])  colonoscope was advanced from the anus through the rectum and colon to the area of the cecum, ileocecal valve and appendiceal orifice.  The cecum was deeply intubated.  These structures were well-seen and photographed for the record.  From the level of the cecum and ileocecal valve, the scope was slowly and cautiously withdrawn.  The mucosal surfaces were carefully surveyed utilizing scope tip deflection to facilitate fold flattening as needed.  The scope was pulled down into the rectum where a thorough examination including retroflexion was performed.    FINDINGS:  Adequate preparation. Multiple rectal polyps-all small. (1) 5 mm polyp just inside the anal verge with a adjacent diminutive polyps. There was (1) 8 mm polyp in the rectum at 10 cm from the anal verge. The remainder of the rectal mucosa appeared normal. Patient had shallow, narrow mouth left-sided diverticula. The patient had  (2) flat adenomatous-appearing polyps in the base the cecum. There the largest was stellate shaped and approximately 1 cm in dimension. Patient had a single diminutive descending polyp; otherwise, the remainder of the colonic mucosa appeared normal.  THERAPEUTIC / DIAGNOSTIC MANEUVERS PERFORMED:  The cecal polyps were removed with the hot snare. One of the polyp polypectomy sites had some residual polyp tissue at the margin  - this was ablated with the APC unit- circular probe.The (1) diminutive descending colon polyp was  cold biopsied/removed. The largest distal rectal polyp was hot snare removed. The rectal polyp at 10 cm was also hot snare removed and submitted separately. The adjacent distal rectal diminutive polyps were ablated with the tip of a hot snare loop.   COMPLICATIONS: none  CECAL WITHDRAWAL TIME:  25 minutes  IMPRESSION:  Multiple colonic and rectal polyps-treated/removed as described above. Colonic diverticulosis.  RECOMMENDATIONS: Followup on pathology.   _______________________________ eSigned:  R. Roetta Sessions, MD FACP [facility] 11/03/2013 8:31 AM   CC:    PATIENT NAME:  Rutilio, Yellowhair MR#: [id number]

## 2013-11-03 NOTE — H&P (View-Only) (Signed)
Primary Care Physician:  Harlow Asa, MD  Primary Gastroenterologist:  Roetta Sessions, MD   Chief Complaint  Patient presents with  . Colonoscopy    HPI:  Wesley Hodges is a 67 y.o. male here to schedule five-year surveillance colonoscopy. He has a history of numerous colonic adenomatous on prior colonoscopies but Dr. Elpidio Anis. His last colonoscopy was in November 2009 by Dr. Jena Gauss. He had multiple hyperplastic polyps removed. Patient also has a history of prior positive H. pylori serologies and was not treated. 2009 we did check an H. pylori stool antigen which was negative. He denies any heartburn, dysphagia, weight loss, anorexia, abdominal pain, melena, rectal bleeding, constipation, diarrhea.  Current Outpatient Prescriptions  Medication Sig Dispense Refill  . Ascorbic Acid (VITAMIN C) 1000 MG tablet Take 1,000 mg by mouth daily.      Marland Kitchen aspirin 81 MG tablet Take 81 mg by mouth daily.      . cholecalciferol (VITAMIN D) 1000 UNITS tablet Take 1,000 Units by mouth daily.      . finasteride (PROPECIA) 1 MG tablet Take 1 mg by mouth daily.       No current facility-administered medications for this visit.    Allergies as of 10/08/2013  . (No Known Allergies)    Past Medical History  Diagnosis Date  . Hx of adenomatous colonic polyps     On multiple colonoscopies by Dr. Elpidio Anis.  Marland Kitchen COPD (chronic obstructive pulmonary disease)   . Hyperlipidemia     Past Surgical History  Procedure Laterality Date  . Colonoscopy  10/15/2008    [id number] rectal polyps, ablated as described above/. Sigmoid and rectosigmoid polyps removed with snare cautery and/or ablated with the tip of snare cautery unit.  The remainder of the colonic mucosa appeared normal    Family History  Problem Relation Age of Onset  . Heart attack Father     Age 33  . Colon cancer Other     maternal grandfather, deceased age 74    History   Social History  . Marital Status: Married    Spouse Name:  N/A    Number of Children: 0  . Years of Education: N/A   Occupational History  . Salesman    Social History Main Topics  . Smoking status: Current Every Day Smoker    Types: Pipe  . Smokeless tobacco: Not on file  . Alcohol Use: Yes     Comment: 3 beers daily  . Drug Use: No  . Sexual Activity: Not on file   Other Topics Concern  . Not on file   Social History Narrative  . No narrative on file      ROS:  General: Negative for anorexia, weight loss, fever, chills, fatigue, weakness. Eyes: Negative for vision changes.  ENT: Negative for hoarseness, difficulty swallowing , nasal congestion. CV: Negative for chest pain, angina, palpitations, dyspnea on exertion, peripheral edema.  Respiratory: Negative for dyspnea at rest, dyspnea on exertion, cough, sputum, wheezing.  GI: See history of present illness. GU:  Negative for dysuria, hematuria, urinary incontinence, urinary frequency, nocturnal urination.  MS: Negative for joint pain, low back pain.  Derm: Negative for rash or itching.  Neuro: Negative for weakness, abnormal sensation, seizure, frequent headaches, memory loss, confusion.  Psych: Negative for anxiety, depression, suicidal ideation, hallucinations.  Endo: Negative for unusual weight change.  Heme: Negative for bruising or bleeding. Allergy: Negative for rash or hives.    Physical Examination:  BP 104/60  Pulse  66  Temp(Src) 98.2 F (36.8 C) (Oral)  Wt 166 lb 3.2 oz (75.388 kg)   General: Well-nourished, well-developed in no acute distress.  Head: Normocephalic, atraumatic.   Eyes: Conjunctiva pink, no icterus. Mouth: Oropharyngeal mucosa moist and pink , no lesions erythema or exudate. Neck: Supple without thyromegaly, masses, or lymphadenopathy.  Lungs: Clear to auscultation bilaterally.  Heart: Regular rate and rhythm, no murmurs rubs or gallops.  Abdomen: Bowel sounds are normal, nontender, nondistended, no hepatosplenomegaly or masses, no  abdominal bruits or    hernia , no rebound or guarding.   Rectal: deferred Extremities: No lower extremity edema. No clubbing or deformities.  Neuro: Alert and oriented x 4 , grossly normal neurologically.  Skin: Warm and dry, no rash or jaundice.   Psych: Alert and cooperative, normal mood and affect.  Labs: No results found for this basename: WBC,  HGB,  HCT,  MCV,  PLT   Lab Results  Component Value Date   CREATININE 0.79 09/02/2013   BUN 13 09/02/2013   NA 137 09/02/2013   K 4.9 09/02/2013   CL 103 09/02/2013   CO2 30 09/02/2013   Lab Results  Component Value Date   ALT 14 09/02/2013   AST 17 09/02/2013   ALKPHOS 42 09/02/2013   BILITOT 0.4 09/02/2013     Imaging Studies: No results found.

## 2013-11-04 ENCOUNTER — Encounter: Payer: Self-pay | Admitting: Internal Medicine

## 2013-11-05 ENCOUNTER — Encounter (HOSPITAL_COMMUNITY): Payer: Self-pay | Admitting: Internal Medicine

## 2014-08-23 IMAGING — CR DG CHEST 2V
2 series · 2 of 2 positions shown · non-contrast
Comparison: 09-04-12

CLINICAL DATA: Smoker. COPD.

EXAM:
CHEST  2 VIEW

[view not recorded (1 of 2)]
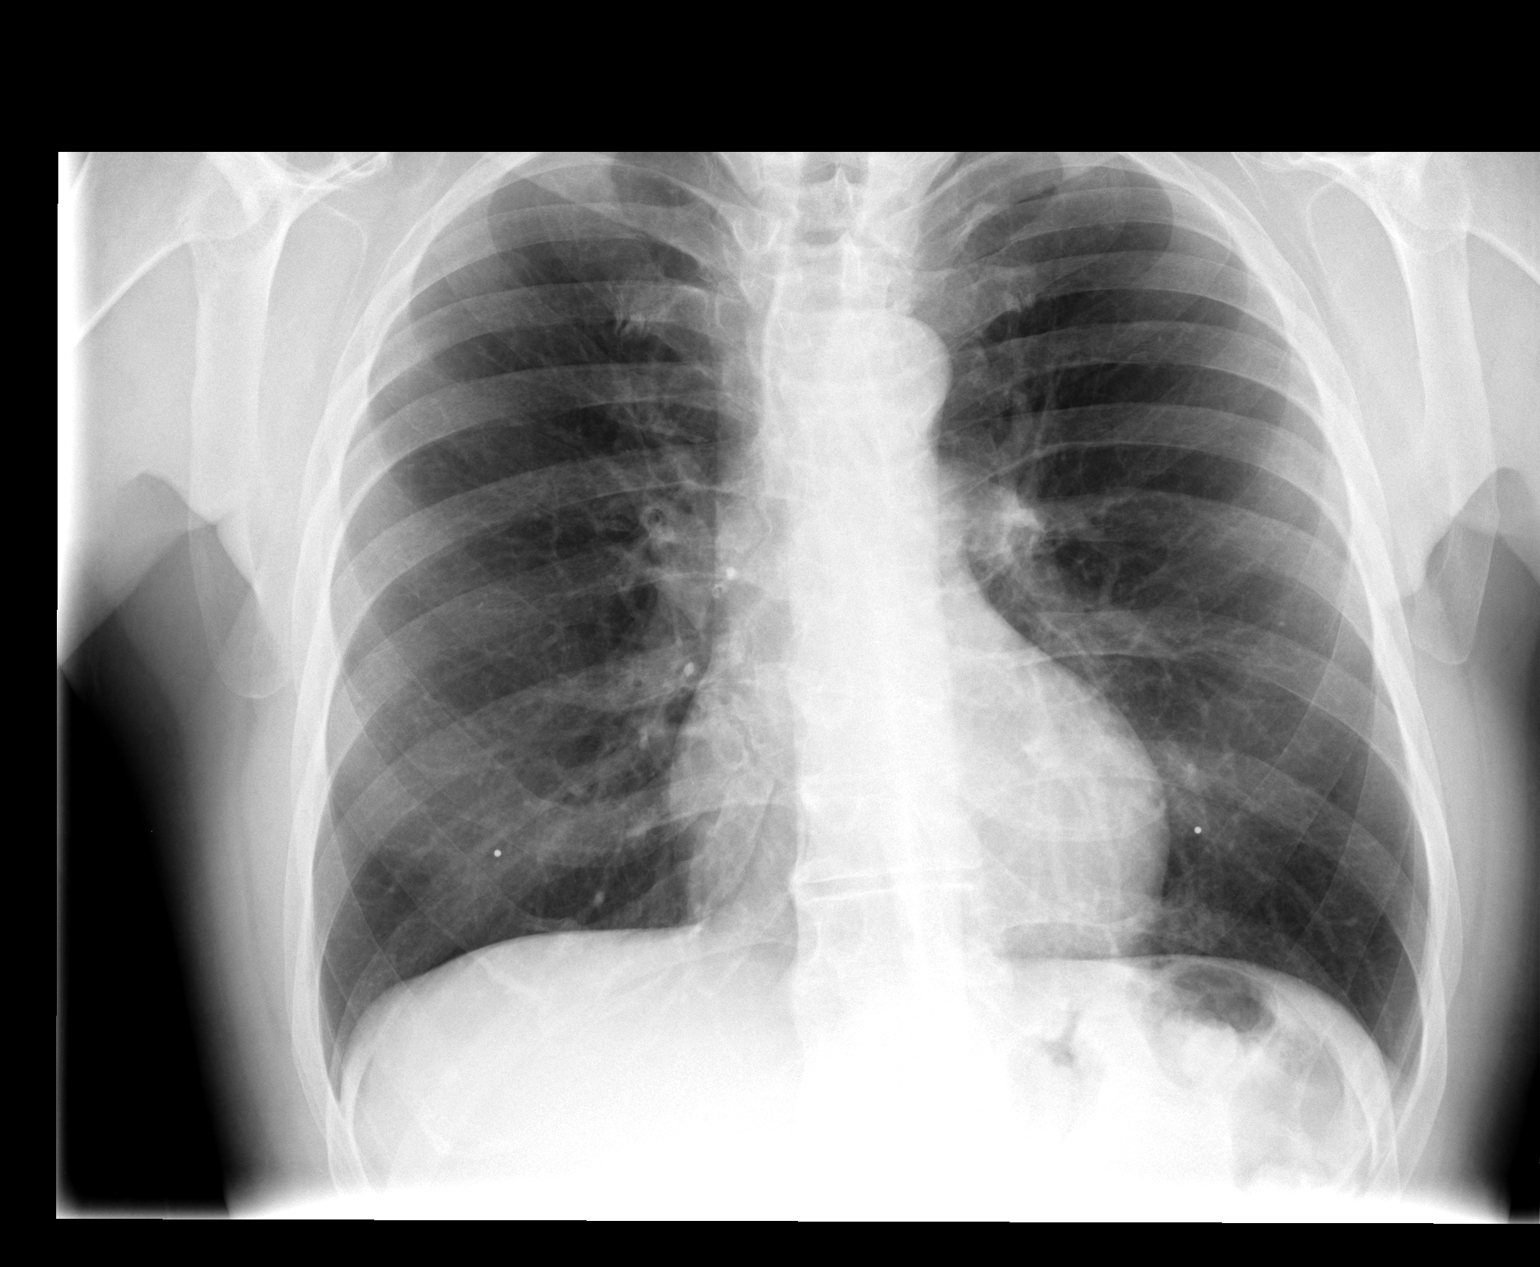

[view not recorded (2 of 2)]
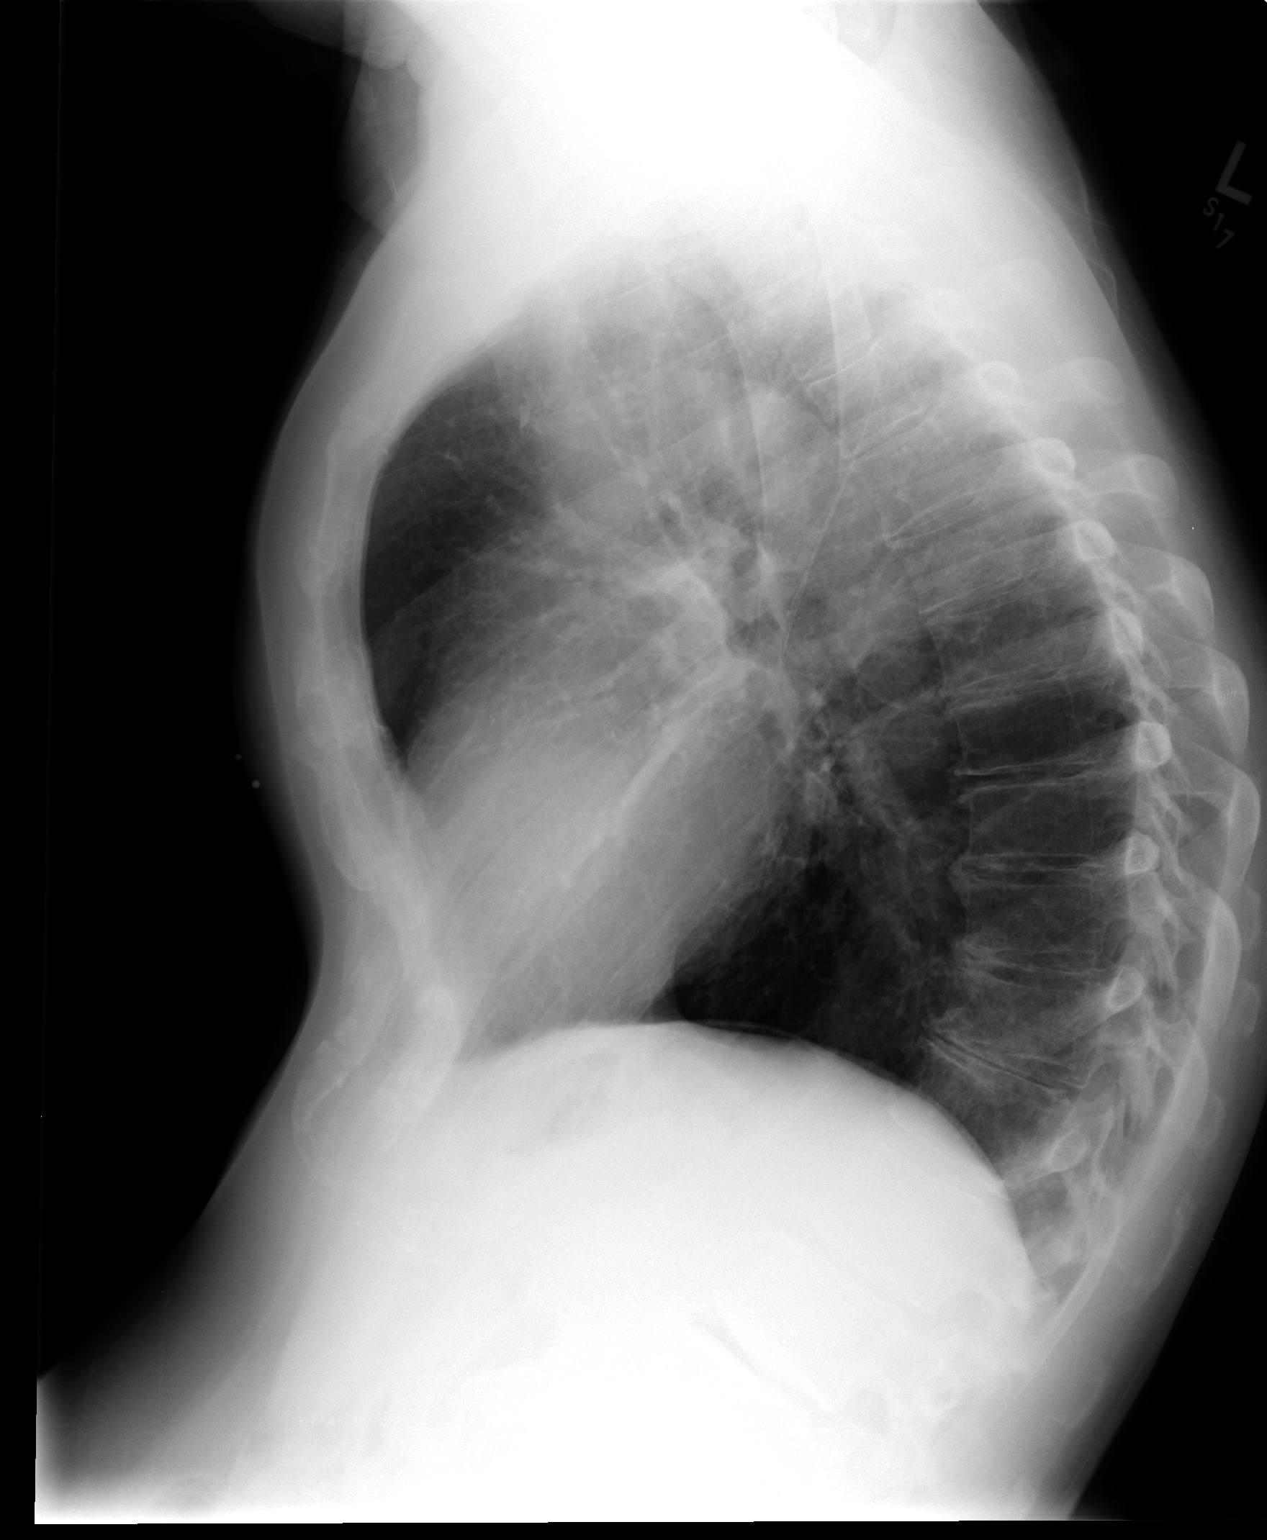

[2 of 2 positions shown; findings below may reference images not displayed]

FINDINGS: Mild pectus excavatum deformity. Mild hyperinflation. Bilateral
nipple markers. Midline trachea. Normal heart size and mediastinal
contours. No pleural effusion or pneumothorax. Clear lungs.
IMPRESSION: Hyperinflation, without acute cardiopulmonary disease.

## 2014-08-27 ENCOUNTER — Telehealth: Payer: Self-pay | Admitting: Family Medicine

## 2014-08-27 DIAGNOSIS — E782 Mixed hyperlipidemia: Secondary | ICD-10-CM

## 2014-08-27 DIAGNOSIS — Z125 Encounter for screening for malignant neoplasm of prostate: Secondary | ICD-10-CM

## 2014-08-27 DIAGNOSIS — J449 Chronic obstructive pulmonary disease, unspecified: Secondary | ICD-10-CM

## 2014-08-27 DIAGNOSIS — F17200 Nicotine dependence, unspecified, uncomplicated: Secondary | ICD-10-CM

## 2014-08-27 DIAGNOSIS — Z79899 Other long term (current) drug therapy: Secondary | ICD-10-CM

## 2014-08-27 NOTE — Addendum Note (Signed)
Addended by: Carmelina Noun on: 08/27/2014 04:25 PM   Modules accepted: Orders

## 2014-08-27 NOTE — Telephone Encounter (Signed)
Pt notified. Chest xray order is in system. Pt to go to [facility] radiology when ready to do xray.

## 2014-08-27 NOTE — Telephone Encounter (Signed)
Lip liv m7 psa 

## 2014-08-27 NOTE — Telephone Encounter (Signed)
Patient needs order for blood work for upcoming physical. Also, he is requesting an order for xray of the chest. He said he is a lifelong smoker and usually gets this done at before his physicals.

## 2014-08-27 NOTE — Telephone Encounter (Signed)
ok 

## 2014-08-27 NOTE — Telephone Encounter (Signed)
Pt also wants order for chest xray. He is a smoker and states he gets one every year at physical.

## 2014-09-08 ENCOUNTER — Ambulatory Visit (HOSPITAL_COMMUNITY)
Admission: RE | Admit: 2014-09-08 | Discharge: 2014-09-08 | Disposition: A | Discharge status: Home / Self Care | Payer: BC Managed Care – PPO | Source: Ambulatory Visit | Attending: Family Medicine | Admitting: Family Medicine

## 2014-09-08 DIAGNOSIS — J449 Chronic obstructive pulmonary disease, unspecified: Secondary | ICD-10-CM | POA: Insufficient documentation

## 2014-09-08 DIAGNOSIS — R05 Cough: Secondary | ICD-10-CM | POA: Diagnosis present

## 2014-09-08 DIAGNOSIS — Z87891 Personal history of nicotine dependence: Secondary | ICD-10-CM | POA: Insufficient documentation

## 2014-09-08 LAB — LIPID PANEL
CHOL/HDL RATIO: 2.8 ratio
CHOLESTEROL: 199 mg/dL (ref 0–200)
HDL: 70 mg/dL (ref >39)
LDL Cholesterol: 111 mg/dL — ABNORMAL HIGH (ref 0–99)
Triglycerides: 90 mg/dL (ref <150)
VLDL: 18 mg/dL (ref 0–40)

## 2014-09-08 LAB — HEPATIC FUNCTION PANEL
ALT: 14 U/L (ref 0–53)
AST: 17 U/L (ref 0–37)
Albumin: 4 g/dL (ref 3.5–5.2)
Alkaline Phosphatase: 44 U/L (ref 39–117)
Bilirubin, Direct: 0.1 mg/dL (ref 0.0–0.3)
Indirect Bilirubin: 0.3 mg/dL (ref 0.2–1.2)
TOTAL PROTEIN: 6.2 g/dL (ref 6.0–8.3)
Total Bilirubin: 0.4 mg/dL (ref 0.2–1.2)

## 2014-09-08 LAB — BASIC METABOLIC PANEL
BUN: 13 mg/dL (ref 6–23)
CALCIUM: 9.3 mg/dL (ref 8.4–10.5)
CO2: 28 mEq/L (ref 19–32)
Chloride: 102 mEq/L (ref 96–112)
Creat: 0.82 mg/dL (ref 0.50–1.35)
GLUCOSE: 100 mg/dL — AB (ref 70–99)
Potassium: 4.7 mEq/L (ref 3.5–5.3)
SODIUM: 136 meq/L (ref 135–145)

## 2014-09-09 LAB — PSA, MEDICARE: PSA: 1.19 ng/mL (ref <=4.00)

## 2014-09-24 ENCOUNTER — Ambulatory Visit (INDEPENDENT_AMBULATORY_CARE_PROVIDER_SITE_OTHER): Payer: BC Managed Care – PPO | Admitting: Family Medicine

## 2014-09-24 ENCOUNTER — Encounter: Payer: Self-pay | Admitting: Family Medicine

## 2014-09-24 VITALS — BP 128/78 | Ht 66.5 in | Wt 167.0 lb

## 2014-09-24 DIAGNOSIS — Z23 Encounter for immunization: Secondary | ICD-10-CM

## 2014-09-24 DIAGNOSIS — Z0000 Encounter for general adult medical examination without abnormal findings: Secondary | ICD-10-CM

## 2014-09-24 MED ORDER — SALSALATE 750 MG PO TABS: 750.0000 mg | ORAL_TABLET | Freq: Two times a day (BID) | ORAL | Status: DC

## 2014-09-24 NOTE — Progress Notes (Signed)
Subjective:    Patient ID: Wesley Hodges, male    DOB: [birth year], 68 y.o.   MRN: [id number]  HPI PATIENT IS HERE FOR A PHYSICAL. PATIENT WOULD LIKE A FLU SHOT & COPY OF BLOOD WORK. PATIENT HAS NO OTHER CONCERNS  Exercises regularly  Walking briskly  Results for orders placed in visit on 08/27/14  LIPID PANEL      Result Value Ref Range   Cholesterol 199  0 - 200 mg/dL   Triglycerides 90  <150 mg/dL   HDL 70  >39 mg/dL   Total CHOL/HDL Ratio 2.8     VLDL 18  0 - 40 mg/dL   LDL Cholesterol 111 (*) 0 - 99 mg/dL  HEPATIC FUNCTION PANEL      Result Value Ref Range   Total Bilirubin 0.4  0.2 - 1.2 mg/dL   Bilirubin, Direct 0.1  0.0 - 0.3 mg/dL   Indirect Bilirubin 0.3  0.2 - 1.2 mg/dL   Alkaline Phosphatase 44  39 - 117 U/L   AST 17  0 - 37 U/L   ALT 14  0 - 53 U/L   Total Protein 6.2  6.0 - 8.3 g/dL   Albumin 4.0  3.5 - 5.2 g/dL  BASIC METABOLIC PANEL      Result Value Ref Range   Sodium 136  135 - 145 mEq/L   Potassium 4.7  3.5 - 5.3 mEq/L   Chloride 102  96 - 112 mEq/L   CO2 28  19 - 32 mEq/L   Glucose, Bld 100 (*) 70 - 99 mg/dL   BUN 13  6 - 23 mg/dL   Creat 0.82  0.50 - 1.35 mg/dL   Calcium 9.3  8.4 - 10.5 mg/dL  PSA, MEDICARE      Result Value Ref Range   PSA 1.19  <=4.00 ng/mL   Diet excellent eats well  COPD none  occas if overworks right arm, has discomfort and pain uses Disalcid when necessary for this.      Colonoscopyone yr ago, due again in tewo yrs    Review of Systems  Constitutional: Negative for fever, activity change and appetite change.  HENT: Negative for congestion and rhinorrhea.   Eyes: Negative for discharge.  Respiratory: Negative for cough and wheezing.   Cardiovascular: Negative for chest pain.  Gastrointestinal: Negative for vomiting, abdominal pain and blood in stool.  Genitourinary: Negative for frequency and difficulty urinating.  Musculoskeletal: Negative for neck pain.  Skin: Negative for rash.    Allergic/Immunologic: Negative for environmental allergies and food allergies.  Neurological: Negative for weakness and headaches.  Psychiatric/Behavioral: Negative for agitation.  All other systems reviewed and are negative.      Objective:   Physical Exam  Vitals reviewed. Constitutional: He appears well-developed and well-nourished.  HENT:  Head: Normocephalic and atraumatic.  Right Ear: External ear normal.  Left Ear: External ear normal.  Nose: Nose normal.  Mouth/Throat: Oropharynx is clear and moist.  Eyes: EOM are normal. Pupils are equal, round, and reactive to light.  Neck: Normal range of motion. Neck supple. No thyromegaly present.  Cardiovascular: Normal rate, regular rhythm and normal heart sounds.   No murmur heard. Pulmonary/Chest: Effort normal and breath sounds normal. No respiratory distress. He has no wheezes.  Abdominal: Soft. Bowel sounds are normal. He exhibits no distension and no mass. There is no tenderness.  Genitourinary: Penis normal.  Musculoskeletal: Normal range of motion. He exhibits no edema.  Lymphadenopathy:    He  has no cervical adenopathy.  Neurological: He is alert. He exhibits normal muscle tone.  Skin: Skin is warm and dry. No erythema.  Psychiatric: He has a normal mood and affect. His behavior is normal. Judgment normal.          Assessment & Plan:  Impression #1 well adult exam #2 chronic smoker with element of COPD plan diet exercise discussed. Next colonoscopy in 2 years. Flu vaccine.

## 2015-01-13 ENCOUNTER — Ambulatory Visit (INDEPENDENT_AMBULATORY_CARE_PROVIDER_SITE_OTHER): Payer: BLUE CROSS/BLUE SHIELD | Admitting: Family Medicine

## 2015-01-13 ENCOUNTER — Encounter: Payer: Self-pay | Admitting: Family Medicine

## 2015-01-13 VITALS — BP 118/76 | Ht 66.5 in | Wt 170.0 lb

## 2015-01-13 DIAGNOSIS — M545 Low back pain: Secondary | ICD-10-CM

## 2015-01-13 MED ORDER — HYDROCODONE-ACETAMINOPHEN 5-325 MG PO TABS: 1.0000 | ORAL_TABLET | Freq: Four times a day (QID) | ORAL | Status: DC | PRN

## 2015-01-13 MED ORDER — ETODOLAC 400 MG PO TABS: 400.0000 mg | ORAL_TABLET | Freq: Two times a day (BID) | ORAL | Status: DC

## 2015-01-13 MED ORDER — CYCLOBENZAPRINE HCL 10 MG PO TABS: 10.0000 mg | ORAL_TABLET | Freq: Three times a day (TID) | ORAL | Status: AC | PRN

## 2015-01-13 NOTE — Progress Notes (Signed)
   Subjective:    Patient ID: Wesley Hodges, male    DOB: 06/06/1946, 69 y.o.   MRN: [id number]  Back Pain This is a recurrent problem. The current episode started 1 to 4 weeks ago. The pain is present in the lumbar spine. The quality of the pain is described as stabbing. The pain does not radiate. Worse during: worse in the am. The symptoms are aggravated by position. Stiffness is present in the morning.   Tightened up, hard to get out of bed, couldn't straihhten up  Very tight   Recall s no sig injury  Trying occas advil one or occas two   dislcid antiinflam irrit stomach also  Chlorzoxazone doent hellp etodolac helped a little  Catch and hurts with motion loosens after a while   This has occurred in the past couple times. Diffuse lumbar discomfort. Minimal radiation in the legs. A lot of stiffness when trying to get around.    Review of Systems  Musculoskeletal: Positive for back pain.   no nausea no vomiting no urinary symptoms no abdominal pain     Objective:   Physical Exam  Alert mild malaise vital stable lungs clear. Heart regular in rhythm no CA tenderness diffuse low back discomfort to deep palpation negative straight leg raise      Assessment & Plan:  Impression lumbar strain/spasm plan Lodine twice a day with food. Flexeril 10 3 times a day. Local measures discussed. Add hydrocodone if needed. WSL

## 2015-05-31 ENCOUNTER — Other Ambulatory Visit: Payer: Self-pay

## 2015-08-26 ENCOUNTER — Telehealth: Payer: Self-pay | Admitting: Family Medicine

## 2015-08-26 DIAGNOSIS — E785 Hyperlipidemia, unspecified: Secondary | ICD-10-CM

## 2015-08-26 DIAGNOSIS — J449 Chronic obstructive pulmonary disease, unspecified: Secondary | ICD-10-CM

## 2015-08-26 DIAGNOSIS — Z79899 Other long term (current) drug therapy: Secondary | ICD-10-CM

## 2015-08-26 DIAGNOSIS — Z125 Encounter for screening for malignant neoplasm of prostate: Secondary | ICD-10-CM

## 2015-08-26 NOTE — Telephone Encounter (Signed)
Patient is requesting lab papers and chest xray and physical schedule for 09/29/15.

## 2015-08-29 NOTE — Telephone Encounter (Signed)
Lip livm7 psa, and ch xray

## 2015-08-30 NOTE — Telephone Encounter (Addendum)
Blood work and chest xray ordered in [facility]. Patient notified.

## 2015-08-30 NOTE — Addendum Note (Signed)
Addended by: Dairl Ponder on: 08/30/2015 10:33 AM   Modules accepted: Orders

## 2015-09-02 ENCOUNTER — Ambulatory Visit (HOSPITAL_COMMUNITY)
Admission: RE | Admit: 2015-09-02 | Discharge: 2015-09-02 | Disposition: A | Discharge status: Home / Self Care | Payer: BLUE CROSS/BLUE SHIELD | Source: Ambulatory Visit | Attending: Family Medicine | Admitting: Family Medicine

## 2015-09-02 DIAGNOSIS — F17200 Nicotine dependence, unspecified, uncomplicated: Secondary | ICD-10-CM | POA: Diagnosis not present

## 2015-09-02 DIAGNOSIS — J449 Chronic obstructive pulmonary disease, unspecified: Secondary | ICD-10-CM | POA: Diagnosis present

## 2015-09-10 LAB — HEPATIC FUNCTION PANEL
ALBUMIN: 4 g/dL (ref 3.6–4.8)
ALT: 15 IU/L (ref 0–44)
AST: 20 IU/L (ref 0–40)
Alkaline Phosphatase: 51 IU/L (ref 39–117)
BILIRUBIN TOTAL: 0.2 mg/dL (ref 0.0–1.2)
BILIRUBIN, DIRECT: 0.09 mg/dL (ref 0.00–0.40)
Total Protein: 6.4 g/dL (ref 6.0–8.5)

## 2015-09-10 LAB — BASIC METABOLIC PANEL
BUN / CREAT RATIO: 16 (ref 10–22)
BUN: 12 mg/dL (ref 8–27)
CO2: 28 mmol/L (ref 18–29)
Calcium: 9.7 mg/dL (ref 8.6–10.2)
Chloride: 98 mmol/L (ref 97–108)
Creatinine, Ser: 0.74 mg/dL — ABNORMAL LOW (ref 0.76–1.27)
GFR calc non Af Amer: 96 mL/min/{1.73_m2} (ref >59)
GFR, EST AFRICAN AMERICAN: 111 mL/min/{1.73_m2} (ref >59)
Glucose: 94 mg/dL (ref 65–99)
Potassium: 5 mmol/L (ref 3.5–5.2)
Sodium: 137 mmol/L (ref 134–144)

## 2015-09-10 LAB — LIPID PANEL
CHOL/HDL RATIO: 3 ratio (ref 0.0–5.0)
CHOLESTEROL TOTAL: 217 mg/dL — AB (ref 100–199)
HDL: 73 mg/dL (ref >39)
LDL Calculated: 125 mg/dL — ABNORMAL HIGH (ref 0–99)
TRIGLYCERIDES: 97 mg/dL (ref 0–149)
VLDL Cholesterol Cal: 19 mg/dL (ref 5–40)

## 2015-09-10 LAB — PSA: Prostate Specific Ag, Serum: 1.5 ng/mL (ref 0.0–4.0)

## 2015-09-29 ENCOUNTER — Ambulatory Visit (INDEPENDENT_AMBULATORY_CARE_PROVIDER_SITE_OTHER): Payer: BLUE CROSS/BLUE SHIELD | Admitting: Family Medicine

## 2015-09-29 ENCOUNTER — Encounter: Payer: Self-pay | Admitting: Family Medicine

## 2015-09-29 VITALS — BP 100/70 | Ht 66.5 in | Wt 163.4 lb

## 2015-09-29 DIAGNOSIS — Z23 Encounter for immunization: Secondary | ICD-10-CM | POA: Diagnosis not present

## 2015-09-29 DIAGNOSIS — Z0000 Encounter for general adult medical examination without abnormal findings: Secondary | ICD-10-CM | POA: Diagnosis not present

## 2015-09-29 NOTE — Progress Notes (Signed)
Subjective:    Patient ID: Wesley Hodges, male    DOB: [birth year], 69 y.o.   MRN: [id number]  HPI The patient comes in today for a wellness visit.    A review of their health history was completed.  A review of medications was also completed.  Any needed refills: n/a  Eating habits: good  Falls/MVA accidents in past few months: none  Regular exercise: walking  Specialist pt sees on regular basis: none  Preventative health issues were discussed.   Additional concerns: Left ear is stopped up  Last colonoscopy- 2 years ago, history of polyps  Recent Results (from the past 2160 hour(s))  Lipid panel     Status: Abnormal   Collection Time: 09/09/15  8:29 AM  Result Value Ref Range   Cholesterol, Total 217 (H) 100 - 199 mg/dL   Triglycerides 97 0 - 149 mg/dL   HDL 73 >39 mg/dL    Comment: According to ATP-III Guidelines, HDL-C >59 mg/dL is considered a negative risk factor for CHD.    VLDL Cholesterol Cal 19 5 - 40 mg/dL   LDL Calculated 125 (H) 0 - 99 mg/dL   Chol/HDL Ratio 3.0 0.0 - 5.0 ratio units    Comment:                                   T. Chol/HDL Ratio                                             Men  Women                               1/2 Avg.Risk  3.4    3.3                                   Avg.Risk  5.0    4.4                                2X Avg.Risk  9.6    7.1                                3X Avg.Risk 23.4   11.0   Hepatic function panel     Status: None   Collection Time: 09/09/15  8:29 AM  Result Value Ref Range   Total Protein 6.4 6.0 - 8.5 g/dL   Albumin 4.0 3.6 - 4.8 g/dL   Bilirubin Total 0.2 0.0 - 1.2 mg/dL   Bilirubin, Direct 0.09 0.00 - 0.40 mg/dL   Alkaline Phosphatase 51 39 - 117 IU/L   AST 20 0 - 40 IU/L   ALT 15 0 - 44 IU/L  Basic metabolic panel     Status: Abnormal   Collection Time: 09/09/15  8:29 AM  Result Value Ref Range   Glucose 94 65 - 99 mg/dL   BUN 12 8 - 27 mg/dL   Creatinine, Ser 0.74 (L) 0.76 - 1.27 mg/dL   GFR calc non Af Amer 96 >59 mL/min/1.73   GFR calc Af  Amer 111 >59 mL/min/1.73   BUN/Creatinine Ratio 16 10 - 22   Sodium 137 134 - 144 mmol/L    Comment: **Effective September 20, 2015 the reference interval**   for Sodium, Serum will be changing to:                                             136 - 144    Potassium 5.0 3.5 - 5.2 mmol/L    Comment: **Effective September 20, 2015 the reference interval**   for Potassium, Serum will be changing to:                          0 -  7 days        3.7 - 5.2                          8 - 30 days        3.7 - 6.4                          1 -  6 months      3.8 - 6.0                   7 months -  1 year        3.8 - 5.3                              >1 year        3.5 - 5.2    Chloride 98 97 - 108 mmol/L    Comment: **Effective September 20, 2015 the reference interval**   for Chloride, Serum will be changing to:                                              97 - 106    CO2 28 18 - 29 mmol/L   Calcium 9.7 8.6 - 10.2 mg/dL  PSA     Status: None   Collection Time: 09/09/15  8:29 AM  Result Value Ref Range   Prostate Specific Ag, Serum 1.5 0.0 - 4.0 ng/mL    Comment: Roche ECLIA methodology. According to the American Urological Association, Serum PSA should decrease and remain at undetectable levels after radical prostatectomy. The AUA defines biochemical recurrence as an initial PSA value 0.2 ng/mL or greater followed by a subsequent confirmatory PSA value 0.2 ng/mL or greater. Values obtained with different assay methods or kits cannot be used interchangeably. Results cannot be interpreted as absolute evidence of the presence or absence of malignant disease.    Review of Systems  Constitutional: Negative for fever, activity change and appetite change.  HENT: Negative for congestion and rhinorrhea.   Eyes: Negative for discharge.  Respiratory: Negative for cough and wheezing.   Cardiovascular: Negative for chest pain.  Gastrointestinal:  Negative for vomiting, abdominal pain and blood in stool.  Genitourinary: Negative for frequency and difficulty urinating.  Musculoskeletal: Negative for neck pain.  Skin: Negative for rash.  Allergic/Immunologic: Negative for environmental allergies and food allergies.  Neurological: Negative for weakness and  headaches.  Psychiatric/Behavioral: Negative for agitation.  All other systems reviewed and are negative.      Objective:   Physical Exam  Constitutional: He appears well-developed and well-nourished.  HENT:  Head: Normocephalic and atraumatic.  Right Ear: External ear normal.  Left Ear: External ear normal.  Nose: Nose normal.  Mouth/Throat: Oropharynx is clear and moist.  Eyes: EOM are normal. Pupils are equal, round, and reactive to light.  Neck: Normal range of motion. Neck supple. No thyromegaly present.  Cardiovascular: Normal rate, regular rhythm and normal heart sounds.   No murmur heard. Pulmonary/Chest: Effort normal and breath sounds normal. No respiratory distress. He has no wheezes.  Abdominal: Soft. Bowel sounds are normal. He exhibits no distension and no mass. There is no tenderness.  Genitourinary: Penis normal.  Musculoskeletal: Normal range of motion. He exhibits no edema.  Kyphoscoliosis evident  Lymphadenopathy:    He has no cervical adenopathy.  Neurological: He is alert. He exhibits normal muscle tone.  Skin: Skin is warm and dry. No erythema.  Psychiatric: He has a normal mood and affect. His behavior is normal. Judgment normal.  Vitals reviewed.         Assessment & Plan:  Impression 1 wellness exam #2 COPD discussed #3 kyphoscoliosis discussed bone density offer patient decline plan encouraged to stop smoking diet exercise discussed. Prevnar injection today. Patient declines 6 high-resolution CT scan with smoking history WSL

## 2016-02-11 ENCOUNTER — Telehealth: Payer: Self-pay | Admitting: Family Medicine

## 2016-03-04 NOTE — Telephone Encounter (Signed)
I was called by EMS. Pt died- apparent MI ( had chest pains for a few days according to wife) Pt was found dead and cool by wife this evening. Pronounced by EMS. I spoke with wife she stated family hx heart disease.Condolences given to the wife. EMS will send [place] death certificate. I stated we will sign it.

## 2016-03-04 DEATH — deceased
